# Patient Record
Sex: Female | Born: 1965 | Race: Black or African American | Hispanic: No | Marital: Single | Smoking: Former smoker
Health system: Southern US, Community
[De-identification: ages and names within clinical notes are randomized; demographics above are authoritative.]

## PROBLEM LIST (undated history)

## (undated) DIAGNOSIS — I82409 Acute embolism and thrombosis of unspecified deep veins of unspecified lower extremity: Secondary | ICD-10-CM

## (undated) DIAGNOSIS — D649 Anemia, unspecified: Secondary | ICD-10-CM

## (undated) DIAGNOSIS — I2699 Other pulmonary embolism without acute cor pulmonale: Secondary | ICD-10-CM

## (undated) HISTORY — DX: Other pulmonary embolism without acute cor pulmonale: I26.99

## (undated) HISTORY — DX: Anemia, unspecified: D64.9

## (undated) HISTORY — DX: Acute embolism and thrombosis of unspecified deep veins of unspecified lower extremity: I82.409

---

## 2004-08-02 ENCOUNTER — Ambulatory Visit: Payer: Self-pay | Admitting: Obstetrics and Gynecology

## 2005-02-17 ENCOUNTER — Emergency Department (HOSPITAL_COMMUNITY): Admission: EM | Admit: 2005-02-17 | Discharge: 2005-02-17 | Payer: Self-pay | Admitting: Family Medicine

## 2005-11-29 ENCOUNTER — Ambulatory Visit: Payer: Self-pay | Admitting: Gynecology

## 2010-11-21 ENCOUNTER — Encounter: Payer: Self-pay | Admitting: Obstetrics and Gynecology

## 2011-01-01 DIAGNOSIS — I2699 Other pulmonary embolism without acute cor pulmonale: Secondary | ICD-10-CM

## 2011-01-01 DIAGNOSIS — I82409 Acute embolism and thrombosis of unspecified deep veins of unspecified lower extremity: Secondary | ICD-10-CM

## 2011-01-01 HISTORY — DX: Acute embolism and thrombosis of unspecified deep veins of unspecified lower extremity: I82.409

## 2011-01-01 HISTORY — DX: Other pulmonary embolism without acute cor pulmonale: I26.99

## 2011-01-06 ENCOUNTER — Encounter: Payer: Self-pay | Admitting: Internal Medicine

## 2011-01-06 ENCOUNTER — Emergency Department (HOSPITAL_COMMUNITY): Payer: Self-pay

## 2011-01-06 ENCOUNTER — Inpatient Hospital Stay (HOSPITAL_COMMUNITY)
Admission: EM | Admit: 2011-01-06 | Discharge: 2011-01-12 | DRG: 176 | Disposition: A | Discharge status: Home / Self Care | Payer: 59 | Attending: Internal Medicine | Admitting: Internal Medicine

## 2011-01-06 DIAGNOSIS — D509 Iron deficiency anemia, unspecified: Secondary | ICD-10-CM | POA: Diagnosis present

## 2011-01-06 DIAGNOSIS — I2699 Other pulmonary embolism without acute cor pulmonale: Principal | ICD-10-CM | POA: Diagnosis present

## 2011-01-06 DIAGNOSIS — M79609 Pain in unspecified limb: Secondary | ICD-10-CM

## 2011-01-06 DIAGNOSIS — R000 Tachycardia, unspecified: Secondary | ICD-10-CM | POA: Diagnosis present

## 2011-01-06 DIAGNOSIS — I824Z9 Acute embolism and thrombosis of unspecified deep veins of unspecified distal lower extremity: Secondary | ICD-10-CM | POA: Diagnosis present

## 2011-01-06 DIAGNOSIS — D259 Leiomyoma of uterus, unspecified: Secondary | ICD-10-CM | POA: Diagnosis present

## 2011-01-06 DIAGNOSIS — J189 Pneumonia, unspecified organism: Secondary | ICD-10-CM

## 2011-01-06 DIAGNOSIS — N390 Urinary tract infection, site not specified: Secondary | ICD-10-CM | POA: Diagnosis present

## 2011-01-06 DIAGNOSIS — E876 Hypokalemia: Secondary | ICD-10-CM | POA: Diagnosis present

## 2011-01-06 DIAGNOSIS — B9689 Other specified bacterial agents as the cause of diseases classified elsewhere: Secondary | ICD-10-CM | POA: Diagnosis present

## 2011-01-06 LAB — FOLATE: Folate: 11 ng/mL

## 2011-01-06 LAB — BASIC METABOLIC PANEL
BUN: 8 mg/dL (ref 6–23)
Calcium: 8.3 mg/dL — ABNORMAL LOW (ref 8.4–10.5)
Calcium: 8 mg/dL — ABNORMAL LOW (ref 8.4–10.5)
Creatinine, Ser: 0.52 mg/dL (ref 0.4–1.2)
Creatinine, Ser: 0.47 mg/dL (ref 0.4–1.2)
GFR calc Af Amer: >60 mL/min (ref >60)
GFR calc non Af Amer: >60 mL/min (ref >60)
GFR calc non Af Amer: >60 mL/min (ref >60)
Glucose, Bld: 123 mg/dL — ABNORMAL HIGH (ref 70–99)

## 2011-01-06 LAB — PROTIME-INR
INR: 1.14 (ref 0.00–1.49)
Prothrombin Time: 14.8 seconds (ref 11.6–15.2)

## 2011-01-06 LAB — CBC
MCH: 17.6 pg — ABNORMAL LOW (ref 26.0–34.0)
MCV: 64.2 fL — ABNORMAL LOW (ref 78.0–100.0)
Platelets: 357 10*3/uL (ref 150–400)
Platelets: 368 10*3/uL (ref 150–400)
RBC: 3.07 MIL/uL — ABNORMAL LOW (ref 3.87–5.11)
RBC: 3.52 MIL/uL — ABNORMAL LOW (ref 3.87–5.11)
RDW: 25.1 % — ABNORMAL HIGH (ref 11.5–15.5)
WBC: 11.6 10*3/uL — ABNORMAL HIGH (ref 4.0–10.5)

## 2011-01-06 LAB — DIFFERENTIAL
Eosinophils Absolute: 0.2 10*3/uL (ref 0.0–0.7)
Lymphocytes Relative: 19 % (ref 12–46)
Monocytes Absolute: 1 10*3/uL (ref 0.1–1.0)
Neutrophils Relative %: 70 % (ref 43–77)

## 2011-01-06 LAB — VITAMIN B12: Vitamin B-12: 408 pg/mL (ref 211–911)

## 2011-01-06 LAB — IRON AND TIBC: UIBC: 369 ug/dL

## 2011-01-06 LAB — GLUCOSE, CAPILLARY: Glucose-Capillary: 115 mg/dL — ABNORMAL HIGH (ref 70–99)

## 2011-01-06 MED ORDER — IOHEXOL 300 MG/ML  SOLN
60.0000 mL | Freq: Once | INTRAMUSCULAR | Status: AC | PRN
  Administered 2011-01-06: 60 mL via INTRAVENOUS

## 2011-01-06 NOTE — Discharge Summary (Signed)
Hospital Admission Note Date: 01/06/2011  Patient name: Brandi Delacruz Medical record number: [id number] Date of birth: 05-26-1966 Age: 45 y.o. Gender: female PCP: No primary provider on file.  Medical Service: IMTS  Attending physician: Dr. Julaine Fusi   Pager: Resident (R2/R3): Dr. Baltazar Apo    Pager: [phone] Resident (R1): Dr. Narda Bonds     Pager: [phone]  Chief Complaint: Calf pain  History of Present Illness: Patient is a previously healthy 45 year old African American female who presents to the ED with right lower extremity pain and mild swelling which she first noticed approximately 4 days ago as"tightness in my calf."  The swelling has persisted and her pain has since worsened, and patient now seeks medical attention as she is scared and recognizes "something just isn't right with my leg still hurting."  She reports it has felt hot to the touch and that her pain is worsened with dorsiflexion.  Furthermore, pt reports she has been suffering from a non-painful, productive cough with clear sputum x 7 days.  She does endorse experiencing some left-sided lateral chest pain starting yesterday, for which she has taken ibuprofen 600mg  BID for the past two days.  She denies any recent history of long trips, fevers, hemoptysis, shortness of breath, or nausea/vomiting.  Of note, however, she works Monday through Friday at a job where she sits at a desk for extended periods of time.  Additionally, while in the ED, patient was found to be profoundly anemic on admission labs.  She reports a regular menstrual cycle every 28-30 days with heavy menses lasting 4-5 days and intermittent spotting lasting approximately 2 more days.  On her heaviest days, she reports having to change pads every 2 hours to avoid bleeding through at work.  She has recently experienced her 4-5 days of heavy bleeding and expects to finish up with her menses today, and denies any lightheadedness, dark or bloody stool, or history of GI  bleeds.   Medications:  None  Allergies:  NKDA  Past Medical History: Hx of anemia, previously treated with iron supplementation; however, patient discontinued supplementation 2/2 constipation  Past Surgical History: None  Family History:  No history of clotting or bleeding disorders  Social History:  Patient is a single mother who lives in [place] with her daughter.  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  She uses tobacco and reports smoking 1-2 Black&Mild cigars per day for a number of years.  She also reports drinking alcohol socially.    Review of Systems: Pertinent items are noted in HPI.  Physical Exam: T 99.4, BP 115/68, P 95, RR 21, O2 Sats 97-100% on 2L General appearance: alert, cooperative, no distress and a bit anxious and emotional Head: Normocephalic, without obvious abnormality, atraumatic Neck: no adenopathy, no carotid bruit and no JVD Lungs: clear to auscultation bilaterally Heart: S1, S2 normal and tachycardic, no murmurs or rubs Abdomen: soft, non-tender; bowel sounds normal; no masses,  no organomegaly Extremities: extremities normal, atraumatic, no cyanosis or edema and Homan's sign positive on the right, right calf is also warm and tender to palpation Pulses: 2+ and symmetric Skin: Skin color, texture, turgor normal. No rashes or lesions Neurologic: Grossly normal   Lab results:  CBC:    Component Value Date/Time   WBC 11.6* 01/06/2011 1247   HGB  4.9 CRITICAL RESULT CALLED TO, READ BACK BY AND VERIFIED WITH: Janalyn Shy RN AT 1311 01/06/11 BYY WOOLLENK* 01/06/2011 1247   HCT 18.9* 01/06/2011 1247   PLT 357 01/06/2011 1247   MCV 61.6* 01/06/2011 1247   NEUTROABS 8.2* 01/06/2011 1247   LYMPHSABS 2.2 01/06/2011 1247   MONOABS 1.0 01/06/2011 1247   EOSABS 0.2 01/06/2011 1247    BASOSABS 0.0 01/06/2011 1247   Comprehensive Metabolic Panel:    Component Value Date/Time   NA 137 01/06/2011 1247   K 3.6 01/06/2011 1247   CL 104 01/06/2011 1247   CO2 28 01/06/2011 1247   BUN 8 01/06/2011 1247   CREATININE 0.52 01/06/2011 1247   GLUCOSE 85 01/06/2011 1247   CALCIUM 8.3* 01/06/2011 1247    Imaging results:     CT ANGIOGRAPHY CHEST WITH CONTRAST    IMPRESSION:    1.  Pulmonary emboli involving upper and lower lobe branches of the   pulmonary arteries bilaterally with moderate clot burden.   2.  Mild cardiomegaly.   3.  Small bilateral pulmonary infarcts.   4.  Thoracic scoliosis.    Other results:  Assessment & Plan by Problem: Previously healthy 45 year old with right lower extremity swelling x 4 days and lateral chest pain x 1 day confirmed to have DVT/PE, also incidentally found to have anemia on admission labs.    1. PE/DVT - Provoked vs unprovoked, unlcear. Diagnosed on CTA and LE U/S on admission. Tachycardic, however hemodynamically stable, so no current indication for thrombolysis.  -admit to ICU for closer monitoring given clot burden and tachycardia. -Start a heparin bridge to Coumadin with INR goal of 2-3. Pharmacy to monitor. -Morphine prn for pain -Not currently hypoxemic, but will continue 02 to keep 02 saturation > 90% given mild respiratory distress/anxiety and plan gradually wean off. -IVF - NS@150cc /hr -hypercoag panel in the outpatient setting  2. Microcytic anemia - baseline Hb unknown.  No evidence of acute bleed. This is likely 2/2 iron deficiency from menorrhagia.  -check Fe studies -replete orally with FeSO4; will start colace 100mg  po BID to minimize GI side effects of Fe supplementation -check transvaginal U/S to assess for presence of fibroids. -transfuse 2units; will transfuse additional units to keep Hb>8

## 2011-01-07 ENCOUNTER — Inpatient Hospital Stay (HOSPITAL_COMMUNITY): Payer: Self-pay

## 2011-01-07 LAB — HEPARIN LEVEL (UNFRACTIONATED)
Heparin Unfractionated: 0.13 IU/mL — ABNORMAL LOW (ref 0.30–0.70)
Heparin Unfractionated: 0.33 IU/mL (ref 0.30–0.70)

## 2011-01-07 LAB — COMPREHENSIVE METABOLIC PANEL
AST: 13 U/L (ref 0–37)
Albumin: 2.7 g/dL — ABNORMAL LOW (ref 3.5–5.2)
Calcium: 8.2 mg/dL — ABNORMAL LOW (ref 8.4–10.5)
Creatinine, Ser: <0.47 mg/dL (ref 0.4–1.2)

## 2011-01-07 LAB — CBC
MCHC: 26.8 g/dL — ABNORMAL LOW (ref 30.0–36.0)
RDW: 27 % — ABNORMAL HIGH (ref 11.5–15.5)

## 2011-01-08 LAB — BASIC METABOLIC PANEL
BUN: 4 mg/dL — ABNORMAL LOW (ref 6–23)
CO2: 26 mEq/L (ref 19–32)
CO2: 26 mEq/L (ref 19–32)
Chloride: 107 mEq/L (ref 96–112)
Chloride: 106 mEq/L (ref 96–112)
Glucose, Bld: 93 mg/dL (ref 70–99)
Glucose, Bld: 118 mg/dL — ABNORMAL HIGH (ref 70–99)
Potassium: 3.3 mEq/L — ABNORMAL LOW (ref 3.5–5.1)
Potassium: 3.3 mEq/L — ABNORMAL LOW (ref 3.5–5.1)
Sodium: 139 mEq/L (ref 135–145)

## 2011-01-08 LAB — URINE MICROSCOPIC-ADD ON

## 2011-01-08 LAB — URINALYSIS, ROUTINE W REFLEX MICROSCOPIC
Bilirubin Urine: NEGATIVE
Glucose, UA: NEGATIVE mg/dL
Ketones, ur: NEGATIVE mg/dL
pH: 7.5 (ref 5.0–8.0)

## 2011-01-08 LAB — CBC
HCT: 21.8 % — ABNORMAL LOW (ref 36.0–46.0)
Hemoglobin: 5.8 g/dL — CL (ref 12.0–15.0)
WBC: 10 10*3/uL (ref 4.0–10.5)

## 2011-01-08 LAB — HEPARIN LEVEL (UNFRACTIONATED): Heparin Unfractionated: 0.43 IU/mL (ref 0.30–0.70)

## 2011-01-09 LAB — CBC
HCT: 22.1 % — ABNORMAL LOW (ref 36.0–46.0)
MCHC: 27.1 g/dL — ABNORMAL LOW (ref 30.0–36.0)
MCV: 66.2 fL — ABNORMAL LOW (ref 78.0–100.0)
RDW: 27.9 % — ABNORMAL HIGH (ref 11.5–15.5)

## 2011-01-09 LAB — URINE CULTURE
Colony Count: >=100000
Culture  Setup Time: 201205081657

## 2011-01-09 LAB — PROTIME-INR: Prothrombin Time: 16 seconds — ABNORMAL HIGH (ref 11.6–15.2)

## 2011-01-10 DIAGNOSIS — J189 Pneumonia, unspecified organism: Secondary | ICD-10-CM

## 2011-01-10 LAB — CROSSMATCH
ABO/RH(D): B POS
Antibody Screen: NEGATIVE
Unit division: 0
Unit division: 0

## 2011-01-10 LAB — BASIC METABOLIC PANEL
BUN: 5 mg/dL — ABNORMAL LOW (ref 6–23)
Chloride: 108 mEq/L (ref 96–112)
Creatinine, Ser: <0.47 mg/dL (ref 0.4–1.2)
Glucose, Bld: 94 mg/dL (ref 70–99)

## 2011-01-10 LAB — CBC
MCH: 18.4 pg — ABNORMAL LOW (ref 26.0–34.0)
MCV: 68.9 fL — ABNORMAL LOW (ref 78.0–100.0)
Platelets: 344 10*3/uL (ref 150–400)
RDW: 30.1 % — ABNORMAL HIGH (ref 11.5–15.5)

## 2011-01-10 LAB — HEPARIN LEVEL (UNFRACTIONATED): Heparin Unfractionated: 0.51 IU/mL (ref 0.30–0.70)

## 2011-01-11 ENCOUNTER — Encounter: Payer: Self-pay | Admitting: Internal Medicine

## 2011-01-11 LAB — ANTIPHOSPHOLIPID SYNDROME EVAL, BLD
PTTLA Confirmation: 15.2 secs — ABNORMAL HIGH (ref <8.0)
Phosphatydalserine, IgA: <20 U/mL (ref <20)
Phosphatydalserine, IgM: <25 U/mL (ref <25)

## 2011-01-11 LAB — BASIC METABOLIC PANEL
BUN: 5 mg/dL — ABNORMAL LOW (ref 6–23)
Calcium: 8.9 mg/dL (ref 8.4–10.5)
Potassium: 3.9 mEq/L (ref 3.5–5.1)

## 2011-01-11 LAB — CBC
MCHC: 27.2 g/dL — ABNORMAL LOW (ref 30.0–36.0)
MCV: 69.6 fL — ABNORMAL LOW (ref 78.0–100.0)
Platelets: 319 10*3/uL (ref 150–400)
RDW: 32.7 % — ABNORMAL HIGH (ref 11.5–15.5)
WBC: 10 10*3/uL (ref 4.0–10.5)

## 2011-01-11 LAB — IRON AND TIBC

## 2011-01-11 LAB — HEPARIN LEVEL (UNFRACTIONATED): Heparin Unfractionated: 0.55 IU/mL (ref 0.30–0.70)

## 2011-01-11 LAB — VITAMIN B12: Vitamin B-12: 329 pg/mL (ref 211–911)

## 2011-01-11 NOTE — Progress Notes (Signed)
Ms Read was found to have a DVT/bilateral PE on admission. She was hemodynamically stable throughout her hospitalization and did not need thromobolysis. 2D echo was normal without RV dysfunction but did show elevated pulmonary artery pressure. She also did have fixed splitting of S2 on CV exam. Regardless, she was heparin bridged to Coumadin and her INR was therapeutic on discharge. 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 She will need at least 6 months of anticoag treatment and the hypercoag panel can be repeated after this so as to get confirmation of whether or not she needs life long anticoagulation. She is to get her INR checked on Monday May 14 after her visit with Dr. Allena Katz. pls ensure that she gets this done.  She was also severley anemic on admission, Hb at 4.9 and MCV of 61. This was 2/2 to severe Fe def from chronic menorrhagia. Transvaginal U/S showed 2 large fibroids. Menses was suppressed during her admission with 6 days of Progesterone. She also received one dose of IV Fe. However, she is to f/u at [id number] for further management of her menorrhagia and an appt has been scheduled with Dr. Penne Lash for May 30. In the meantime she is to continue her oral Fe supplements.  Pls see discharge summary for discharge medications and other diagnosis.

## 2011-01-12 LAB — HEPARIN LEVEL (UNFRACTIONATED): Heparin Unfractionated: 0.25 IU/mL — ABNORMAL LOW (ref 0.30–0.70)

## 2011-01-12 LAB — BASIC METABOLIC PANEL
CO2: 28 mEq/L (ref 19–32)
Chloride: 106 mEq/L (ref 96–112)
Creatinine, Ser: 0.47 mg/dL (ref 0.4–1.2)
GFR calc Af Amer: >60 mL/min (ref >60)
Potassium: 3.9 mEq/L (ref 3.5–5.1)
Sodium: 141 mEq/L (ref 135–145)

## 2011-01-12 LAB — DIFFERENTIAL
Basophils Relative: 0 % (ref 0–1)
Monocytes Relative: 10 % (ref 3–12)
Neutro Abs: 5.9 10*3/uL (ref 1.7–7.7)
Neutrophils Relative %: 67 % (ref 43–77)

## 2011-01-12 LAB — CBC
Hemoglobin: 7.3 g/dL — ABNORMAL LOW (ref 12.0–15.0)
MCH: 19.5 pg — ABNORMAL LOW (ref 26.0–34.0)
RBC: 3.75 MIL/uL — ABNORMAL LOW (ref 3.87–5.11)

## 2011-01-12 LAB — PROTIME-INR: INR: 2.63 — ABNORMAL HIGH (ref 0.00–1.49)

## 2011-01-14 ENCOUNTER — Ambulatory Visit (INDEPENDENT_AMBULATORY_CARE_PROVIDER_SITE_OTHER): Payer: 59 | Admitting: Internal Medicine

## 2011-01-14 ENCOUNTER — Ambulatory Visit (INDEPENDENT_AMBULATORY_CARE_PROVIDER_SITE_OTHER): Payer: 59 | Admitting: Pharmacist

## 2011-01-14 ENCOUNTER — Encounter: Payer: Self-pay | Admitting: Internal Medicine

## 2011-01-14 DIAGNOSIS — I2699 Other pulmonary embolism without acute cor pulmonale: Secondary | ICD-10-CM

## 2011-01-14 DIAGNOSIS — I82409 Acute embolism and thrombosis of unspecified deep veins of unspecified lower extremity: Secondary | ICD-10-CM

## 2011-01-14 DIAGNOSIS — Z7901 Long term (current) use of anticoagulants: Secondary | ICD-10-CM

## 2011-01-14 DIAGNOSIS — D259 Leiomyoma of uterus, unspecified: Secondary | ICD-10-CM | POA: Insufficient documentation

## 2011-01-14 LAB — CULTURE, BLOOD (ROUTINE X 2)
Culture  Setup Time: 201205081626
Culture: NO GROWTH

## 2011-01-14 LAB — POCT INR: INR: 5.3

## 2011-01-14 MED ORDER — WARFARIN SODIUM 5 MG PO TABS: ORAL_TABLET | ORAL | Status: DC

## 2011-01-14 NOTE — Assessment & Plan Note (Signed)
She is to be followed up with OB/GYN on 01/30/2011 for evaluation and management of her fibroid.

## 2011-01-14 NOTE — Progress Notes (Signed)
Anti-Coagulation Progress Note  Brandi Delacruz is a 45 y.o. female who is currently on an anti-coagulation regimen.    RECENT RESULTS: Recent results are below, the most recent result is correlated with a dose of warfarin dosed by the inpatient pharmacy dept. Lab Results  Component Value Date   INR 5.3 01/14/2011   INR 2.63* 01/12/2011   INR 2.12* 01/11/2011    ANTI-COAG DOSE:   Latest dosing instructions   Total Sun Mon Tue Wed Thu Fri Sat   25 5 mg  2.5 mg 5 mg 5 mg 2.5 mg 5 mg    (5 mg1)  (5 mg0.5) (5 mg1) (5 mg1) (5 mg0.5) (5 mg1)         ANTICOAG SUMMARY: Anticoagulation Episode Summary              Current INR goal 2.0-3.0 Next INR check 01/21/2011   INR from last check 5.3! (01/14/2011)     Weekly max dose (mg)  Target end date 01/14/2012   Indications    INR check location [facility]erred lab    Send INR reminders to ANTICOAG IMP   Comments             ANTICOAG TODAY: Anticoagulation Summary as of 01/14/2011              INR goal 2.0-3.0     Selected INR 5.3! (01/14/2011) Next INR check 01/21/2011   Weekly max dose (mg)  Target end date 01/14/2012   Indications     Anticoagulation Episode Summary              INR check location [facility] Preferred lab    Send INR reminders to ANTICOAG IMP   Comments             PATIENT INSTRUCTIONS: Patient Instructions  Patient instructed to take medications as defined in the Anti-coagulation Track section of this encounter.  Patient instructed to OMIT/HOLD today's dose.  Patient verbalized understanding of these instructions.        FOLLOW-UP Return in 7 days (on 01/21/2011) for Follow up INR.  Hulen Luster, III Pharm.D., CACP

## 2011-01-14 NOTE — Progress Notes (Signed)
  Subjective:    Patient ID: Brandi Delacruz, female    DOB: [birth year], 45 y.o.   MRN: [id number]  HPI Ms. Zaro is a pleasant 45 year old woman with recent admission to the hospital for DVT and bilateral PE comes for a followup visit. She is new to the clinic. She had about 7 days of hospitalization with bridging therapy with heparin and Coumadin and was discharged after INR was therapeutic. She said she got good physical therapy and is doing exercises as instructed and is taking her Coumadin regularly. She still has pain in the right leg but says is much better than what she had in hospital. Also says that the swelling has decreased significantly and she is feeling better now. Complains of some cough with phlegm for last 2 days but denies any fever, chills, chest pain, shortness of breath or blood in the phlegm. Denies any headache, palpitations or abdominal pain, diarrhea, dizziness the   Review of Systems    as per history of present illness Objective:   Physical Exam    Constitutional: Vital signs reviewed.  Patient is a well-developed and well-nourished in no acute distress and cooperative with exam. Alert and oriented x3.  Head: Normocephalic and atraumatic Mouth: no erythema or exudates, MMM Eyes: PERRL, EOMI, conjunctivae normal, No scleral icterus.  Neck: Supple, Trachea midline normal ROM, No JVD, mass, thyromegaly, or carotid bruit present.  Cardiovascular: RRR, S1 normal, S2 normal, no MRG, pulses symmetric and intact bilaterally Pulmonary/Chest: CTAB, no wheezes, rales, or rhonchi Abdominal: Soft. Non-tender, non-distended, bowel sounds are normal, no masses, organomegaly, or guarding present.  Musculoskeletal: Right leg-mild tenderness to palpation to the calf. Right leg is swollen and tender compared to the left. No significant warmth, rash. Neurological: A&O x3, Strenght is normal and symmetric bilaterally, cranial nerve II-XII are grossly intact, no focal motor deficit,  sensory intact to light touch bilaterally.  Skin: Warm, dry and intact. No rash, cyanosis, or clubbing.      Assessment & Plan:

## 2011-01-14 NOTE — Assessment & Plan Note (Signed)
Discharge from hospital after bridging therapy with heparin and Coumadin. She'll see Dr. Alexandria Lodge today and get her INR checked and adjusted dose of Coumadin according to that. Her swelling in the right leg is a significantly decreased compared to the admission swelling as per patient. She will need 6 months of Coumadin therapy and then reevaluation for continuation or not.

## 2011-01-14 NOTE — Assessment & Plan Note (Signed)
As per DVT

## 2011-01-14 NOTE — Patient Instructions (Signed)
Patient instructed to take medications as defined in the Anti-coagulation Track section of this encounter.  Patient instructed to OMIT/HOLD today's dose.  Patient verbalized understanding of these instructions.    

## 2011-01-14 NOTE — Patient Instructions (Addendum)
Please make a follow up appointment inh 6-8 weeks. Please take your coumdin regularly and follow with Dr. Alexandria Lodge for INR check and dose adjustment with coumadin. Also [facility]

## 2011-01-15 ENCOUNTER — Telehealth: Payer: Self-pay | Admitting: *Deleted

## 2011-01-15 ENCOUNTER — Encounter: Payer: Self-pay | Admitting: Internal Medicine

## 2011-01-15 NOTE — Telephone Encounter (Signed)
Pt called and states she was in hospital from 5/6 to 5/12. She needs a note to document that she was in hospital and the  Amount to time she will need to be out of work.  Can you write this?

## 2011-01-16 NOTE — Telephone Encounter (Signed)
Letter written and faxed to pt.

## 2011-01-21 ENCOUNTER — Ambulatory Visit (INDEPENDENT_AMBULATORY_CARE_PROVIDER_SITE_OTHER): Payer: 59 | Admitting: Pharmacist

## 2011-01-21 DIAGNOSIS — Z7901 Long term (current) use of anticoagulants: Secondary | ICD-10-CM

## 2011-01-21 DIAGNOSIS — I2699 Other pulmonary embolism without acute cor pulmonale: Secondary | ICD-10-CM

## 2011-01-21 DIAGNOSIS — I82409 Acute embolism and thrombosis of unspecified deep veins of unspecified lower extremity: Secondary | ICD-10-CM

## 2011-01-21 LAB — POCT INR: INR: 3.3

## 2011-01-21 NOTE — Patient Instructions (Signed)
Patient instructed to take medications as defined in the Anti-coagulation Track section of this encounter.  Patient instructed to take today's dose.  Patient verbalized understanding of these instructions.    

## 2011-01-21 NOTE — Progress Notes (Signed)
Anti-Coagulation Progress Note  Brandi Delacruz is a 45 y.o. female who is currently on an anti-coagulation regimen.    RECENT RESULTS: Recent results are below, the most recent result is correlated with a dose of 20 mg over 6 days. Lab Results  Component Value Date   INR 3.3 01/21/2011   INR 5.3 01/14/2011   INR 2.63* 01/12/2011    ANTI-COAG DOSE:   Latest dosing instructions   Total Glynis Smiles Tue Wed Thu Fri Sat   25 5 mg 2.5 mg 2.5 mg 5 mg 2.5 mg 5 mg 2.5 mg    (5 mg1) (5 mg0.5) (5 mg0.5) (5 mg1) (5 mg0.5) (5 mg1) (5 mg0.5)         ANTICOAG SUMMARY: Anticoagulation Episode Summary              Current INR goal 2.0-3.0 Next INR check 01/29/2011   INR from last check 3.3! (01/21/2011)     Weekly max dose (mg)  Target end date 01/14/2012   Indications    INR check location [facility]erred lab    Send INR reminders to ANTICOAG IMP   Comments             ANTICOAG TODAY: Anticoagulation Summary as of 01/21/2011              INR goal 2.0-3.0     Selected INR 3.3! (01/21/2011) Next INR check 01/29/2011   Weekly max dose (mg)  Target end date 01/14/2012   Indications     Anticoagulation Episode Summary              INR check location [facility] Preferred lab    Send INR reminders to ANTICOAG IMP   Comments             PATIENT INSTRUCTIONS: Patient Instructions  Patient instructed to take medications as defined in the Anti-coagulation Track section of this encounter.  Patient instructed to take today's dose.  Patient verbalized understanding of these instructions.        FOLLOW-UP Return in 8 days (on 01/29/2011) for Follow up INR.  Hulen Luster, III Pharm.D., CACP

## 2011-01-23 NOTE — Discharge Summary (Signed)
NAME:  Brandi Delacruz, Brandi Delacruz               ACCOUNT NO.:  [id number]  MEDICAL RECORD NO.:  [id number]           PATIENT TYPE:  I  LOCATION:  2003                         FACILITY:  [facility]  PHYSICIAN:  Edsel Petrin, D.O.DATE OF BIRTH:  1965-09-23  DATE OF ADMISSION:  01/06/2011 DATE OF DISCHARGE:  01/12/2011                              DISCHARGE SUMMARY   DISCHARGE DIAGNOSES: 1. Deep venous thrombosis. 2. Pulmonary embolism. 3. Iron-deficiency anemia. 4. Menorrhagia. 5. Leiomyomata x2. 6. Hypokalemia, resolved. 7. Urinary tract infection with Trichomonas.  DISCHARGE MEDICATIONS: 1. Coumadin 7.5 mg taken orally until seen by Dr. Michaell Cowing at the     [facility]. 2. Colace 100 mg taken orally twice daily for constipation. 3. Ferrous sulfate 325 mg taken orally twice daily. 4. Percocet 5/325 mg tablets take 1 tablet every 4 hours as needed for     pain. 5. Tussionex 5 mL taken orally twice daily as needed for cough. 6. Tessalon 100 mg tablets taken orally 3 times a day as needed for     cough.  DISPOSITION AND FOLLOWUP:  The patient was discharged home in stable condition and is to follow up with Dr. Michaell Cowing at the [facility] on Jan 14, 2011, at 8:15 a.m.  At this appointment, her INR is to be rechecked.  The patient is also to follow with Dr. Lyn Hollingshead at the [facility] where she should be followed up regarding her Coumadin therapy, her cough as well as her pain symptoms.  The patient is also to follow up with GYN, Dr. Penne Lash on Jan 30, 2011, at 1:45 p.m.  At this appointment, the patient is to have her fibroid evaluated further.  PROCEDURES: 1. The patient had a CT angio on Jan 06, 2011, which showed pulmonary     emboli involving upper and lower lobe branches of the pulmonary     arteries bilaterally with moderate clot burden, mild cardiomegaly,     small bilateral pulmonary infarct and thoracic scoliosis. 2. A transvaginal ultrasound, which showed  an enlarged uterus but     least two large fundal fibroid measuring 8.1 cm and 7.6 cm. 3. The patient had a 2-D echo on Jan 08, 2011, which showed ejection     fraction 60-65%, diastolic parameters were normal with a pulmonary     artery peak pressure of 41 mmHg.  CONSULTATIONS:  None.  INITIAL HISTORY AND PHYSICAL:  On day of admission, the patient is a previously healthy 45 year old African American woman who presented to the emergency department with right lower extremity and mild swelling, which she first noticed 4 days prior to admission and described as tightness in her cast.  The swelling has persisted and her pain since worsened and the patient went to seek medical attention.  She reports it felt hot to the touch and the pain had worsened with dorsi flexion. Furthermore, the patient reports that she had been suffering from a nonpainful productive cough with clear sputum for 7 days.  She did endorse experiencing some left-sided lateral chest pain starting 1 day prior to admission for which she took ibuprofen 600  mg twice a day for 2 days.  She denied any history of long trips, fevers, hemoptysis, shortness of breath or nausea and vomiting.  Of note, she works Monday through Friday at a job where she sits at her desk for extended periods of time.  Additionally while in the emergency department, she was found to be profoundly anemic on it with her admission labs.  She reports a regular menstrual cycles every 28-30 days with heavy menses lasting 4-5 days and intermittent spotting lasting approximately 2 more days.  On her heaviest day, she reports having to change her pads every 2 hours to avoid bleeding at work.  She recently experienced her 4-5 days of heavy bleeding.  In fact, she finished with her menses on day of admission. She denied any lightheadedness, darker bloody stools or history of GI bleed.  She had no outpatient medications.  ALLERGIES:  No known drug  allergies.  PAST MEDICAL HISTORY:  History of anemia, previously treated with iron supplementation but, however, she has stopped taking iron supplements.  FAMILY HISTORY:  No history of clotting or bleeding disorders.  GYN HISTORY:  As mentioned above.  The patient is G3, P1 previous pregnancies actually for elective abortion.  SOCIAL HISTORY:  She is a single mother living in [place] with her daughter.  PHYSICAL EXAMINATION:  VITALS:  On day of admission temperature 99.4, blood pressure 115/68, pulse 95, respirations 21, O2 sat 97% on 2 liters. GENERAL:  The patient was alert, cooperative in no distress but a little bit anxious. HEAD:  Normocephalic without obvious abnormalities. NECK:  No adenopathy.  No carotid bruits. LUNGS:  Clear to auscultation bilaterally. HEART:  S1-S2 normal and was tachycardic.  No murmurs or rubs. ABDOMEN:  Soft, nontender.  Bowel sounds normal.  No masses.  No organomegaly. EXTREMITIES:  Extremities were normal atraumatic.  No cyanosis or edema and there was Homans signs positive on the right.  Right calf was also warm and tender to palpation.  Pulse is 2+ and symmetric. SKIN:  Skin color was texture and turgor normal.  No rashes or lesions. NEUROLOGIC:  Grossly normal.  LABORATORY DATA:  The patient had a WBC count of 11.6, hemoglobin 4.9, hematocrit 18.9, platelets 257.  Sodium of 137, potassium 3.6, chloride 104, bicarb 28, BUN 8, creatinine 0.52, glucose 85, calcium 8.3.  HOSPITAL COURSE BY PROBLEM: 1. Deep venous thrombosis/pulmonary emboli.  The patient initially     presented with right calf tenderness and swelling.  She was found     to have a deep venous thrombosis and because of cough, she also had     a CT angio of her chest, which showed pulmonary emboli involving     the upper and lower branches of the pulmonary arteries bilaterally     with moderate clot burden along with small bilateral pulmonary     infarcts.  The patient was  started on Lovenox bridge to Coumadin.     Her INR was therapeutic after 3 days of bridging and was continued     for a total of 5 days.  The patient was controlled with morphine     and Percocet during hospital stay.  Given the patient's     presentation of DVT and pulmonary embolus with no known risk     factors.  The patient was tested for antiphospholipid syndrome.  It     was detected, however, the patient did receive 4000 units of     heparin prior to  the test and since it is a PTT base study may be a     false positive.  A hypercoagulable panel was need to be checked as     an outpatient once the patient is off anticoagulation.  The patient     will continue on Coumadin for at least 6 months. 2. Iron-deficiency anemia.  On admission, the patient was     hemodynamically stable with some slight tachycardia.  Initial labs     showed a hemoglobin of 4.9.  The patient was found to have regular     heavy menses and reported that she had just finished 5-6 days of     heavy bleeding during which she changed her past every 2 hours.     Anemia.  Therefore was thought to be secondary to chronic regular     menorrhagia.  Transfusion was started with PRBCs, however, the     patient subsequently became febrile.  Transfusion of only 1 unit     was completed.  Additional units were not given as there was     concern for transfusion reaction and the patient was     hemodynamically stable.  Additionally after admission, the patient     remained stable with normal heart rate, satting well, and denies     shortness of breath.  Iron was repleted orally and through IV.  The     patient was discharged with a prescription of ferrous sulfate to be     taken twice a day.  She is also discharged on Colace to help with     any iron related constipation. 3. Menorrhagia.  Given the patient's history of irregular heavy     bleeding, this was felt to be causes of the patient's iron-     deficiency anemia.  The  patient's menses was initially suppress     with medroxyprogesterone, which was taken for 6 days.  The patient     was previously seen at [facility] in the past and she was     instructed to follow up with GYN. 4. Leiomyomata.  Transvaginal ultrasound was done on Jan 07, 2011,     which showed enlarged uterus with two large fundal fibroid.     Findings were thought to contribute to the patient's menorrhagia     and she is to follow with GYN as scheduled. 5. Hypokalemia.  This was resolved with oral repletion.  No magnesium     was checked and was within normal limits. 6. Urinary tract infection with Trichomonas.  The patient denied     having any urinary symptoms including dysuria, increased frequency,     urge or discoloration of urine.  It was considering an     uncomplicated urinary tract infection.  She was given ciprofloxacin     for 3 days and a single dose of Flagyl.  She did, however, complain     of some abdominal tenderness.  DISCHARGE VITALS:  On day of discharge, temperature 97, pulse 78, respirations 20, blood pressure 110/71, O2 sat of 99% on room air.  LABS ON DAY OF DISCHARGE:  The patient had a white count of 8.9, hemoglobin 7.3, hematocrit 27.2 and platelets of 291.  INR on day of discharge was therapeutic at 2.63 and BMET with a sodium of 141, potassium 3.9, chloride 106, bicarb 28, glucose 100, BUN 9, creatinine 0.47.     Melida Quitter, MD   ______________________________ Edsel Petrin, D.O.  AS/MEDQ  D:  01/13/2011  T:  01/13/2011  Job:  [id number]  cc:   Lesly Dukes, M.D. Lyn Hollingshead, MD  Electronically Signed by Melida Quitter  on 01/16/2011 02:35:25 PM Electronically Signed by Anderson Malta D.O. on 01/23/2011 01:48:04 PM

## 2011-01-29 ENCOUNTER — Ambulatory Visit (INDEPENDENT_AMBULATORY_CARE_PROVIDER_SITE_OTHER): Payer: 59 | Admitting: Pharmacist

## 2011-01-29 DIAGNOSIS — I82409 Acute embolism and thrombosis of unspecified deep veins of unspecified lower extremity: Secondary | ICD-10-CM

## 2011-01-29 DIAGNOSIS — Z7901 Long term (current) use of anticoagulants: Secondary | ICD-10-CM

## 2011-01-29 DIAGNOSIS — I2699 Other pulmonary embolism without acute cor pulmonale: Secondary | ICD-10-CM

## 2011-01-29 LAB — POCT INR: INR: 3.3

## 2011-01-29 NOTE — Patient Instructions (Signed)
Patient instructed to take medications as defined in the Anti-coagulation Track section of this encounter.  Patient instructed to OMIT today's dose.  Patient verbalized understanding of these instructions.    

## 2011-01-29 NOTE — Progress Notes (Signed)
Anti-Coagulation Progress Note  Brandi Delacruz is a 45 y.o. female who is currently on an anti-coagulation regimen.    RECENT RESULTS: Recent results are below, the most recent result is correlated with a dose of 22.5 mg. per week: Lab Results  Component Value Date   INR 3.3 01/29/2011   INR 3.3 01/21/2011   INR 5.3 01/14/2011    ANTI-COAG DOSE:   Latest dosing instructions   Total Sun Mon Tue Wed Thu Fri Sat   22.5 2.5 mg 5 mg 2.5 mg 2.5 mg 5 mg 2.5 mg 2.5 mg    (5 mg0.5) (5 mg1) (5 mg0.5) (5 mg0.5) (5 mg1) (5 mg0.5) (5 mg0.5)         ANTICOAG SUMMARY: Anticoagulation Episode Summary              Current INR goal 2.0-3.0 Next INR check 02/19/2011   INR from last check 3.3! (01/29/2011)     Weekly max dose (mg)  Target end date 01/14/2012   Indications    IN[facility] check location [facility]    Send INR reminders to [facility]   Comments             ANTICOAG TODAY: Anticoagulation Summary as of 01/29/2011              INR goal 2.0-3.0     Selected INR 3.3! (01/29/2011) Next INR check 02/19/2011   Weekly max dose (mg)  Target end date 01/14/2012   Indications     Anticoagulation Episode Summary              INR check location [facility]    Send INR reminders to [facility]   Comments             PATIENT INSTRUCTIONS: Patient Instructions  Patient instructed to take medications as defined in the Anti-coagulation Track section of this encounter.  Patient instructed to OMIT today's dose.  Patient verbalized understanding of these instructions.        FOLLOW-UP Return in 3 weeks (on 02/19/2011) for Follow up INR.  Hulen Luster, III Pharm.D., CACP

## 2011-01-30 ENCOUNTER — Other Ambulatory Visit: Payer: Self-pay | Admitting: Obstetrics & Gynecology

## 2011-01-30 ENCOUNTER — Ambulatory Visit (INDEPENDENT_AMBULATORY_CARE_PROVIDER_SITE_OTHER): Payer: 59 | Admitting: Obstetrics & Gynecology

## 2011-01-30 ENCOUNTER — Other Ambulatory Visit (HOSPITAL_COMMUNITY)
Admission: RE | Admit: 2011-01-30 | Discharge: 2011-01-30 | Disposition: A | Discharge status: Home / Self Care | Payer: 59 | Source: Ambulatory Visit | Attending: Obstetrics & Gynecology | Admitting: Obstetrics & Gynecology

## 2011-01-30 ENCOUNTER — Other Ambulatory Visit: Payer: Self-pay | Admitting: Family Medicine

## 2011-01-30 DIAGNOSIS — N949 Unspecified condition associated with female genital organs and menstrual cycle: Secondary | ICD-10-CM | POA: Insufficient documentation

## 2011-01-30 DIAGNOSIS — Z01419 Encounter for gynecological examination (general) (routine) without abnormal findings: Secondary | ICD-10-CM

## 2011-01-30 DIAGNOSIS — N938 Other specified abnormal uterine and vaginal bleeding: Secondary | ICD-10-CM | POA: Insufficient documentation

## 2011-01-30 DIAGNOSIS — N920 Excessive and frequent menstruation with regular cycle: Secondary | ICD-10-CM

## 2011-01-30 DIAGNOSIS — Z1231 Encounter for screening mammogram for malignant neoplasm of breast: Secondary | ICD-10-CM

## 2011-01-30 DIAGNOSIS — D259 Leiomyoma of uterus, unspecified: Secondary | ICD-10-CM

## 2011-01-31 NOTE — Group Therapy Note (Signed)
NAME:  Brandi Delacruz, Brandi Delacruz               ACCOUNT NO.:  [id number]  MEDICAL RECORD NO.:  [id number]           PATIENT TYPE:  A  LOCATION:  [facility]                   FACILITY:  [facility]  PHYSICIAN:  Elsie Lincoln, MD      DATE OF BIRTH:  12-14-1965  DATE OF SERVICE:                                 CLINIC NOTE  The patient presents for followup of fibroids in menorrhagia causing anemia.  The patient was discharged from the hospital on Jan 12, 2011, with anemia.  It looks like she got 2 units of blood, but it is unclear from the discharge dictation.  She was discharged with a hemoglobin of 7.3.  I gave her prescription for ferrous sulfate.  She was hospitalized mostly for the pain process and bilateral pulmonary embolism.  She has lupus anticoagulant positive, and she is currently on Coumadin, which was slightly supratherapeutic and they changed her dose.  So, she has only been on anticoagulation for approximately 2 weeks.  She was noted to have an enlarged uterus approximately 14-16 weeks' size with 2 large fibroids.  She has been having mild dysmenorrhea with heavy periods for a long time.  She never had diagnosis of fibroids before.  PAST MEDICAL HISTORY: 1. Anemia. 2. Now DVT. 3. Lupus anticoagulant positive. 4. Pulmonary embolism.  PAST SURGICAL HISTORY:  Denies all surgeries.  OBSTETRICAL HISTORY:  NSVD x1 and three terminations.  Heavy periods as above.  She has had abnormal Pap smear in 1990 that inquire colpo.  She never had freezing or burning of her cervix.  The last Pap smear here was in 2007 and normal.  FAMILY HISTORY:  Positive for a sister with breast cancer, but her sister who tested for the breast cancer gene and is negative.  There is no colon, ovarian, or uterine cancer.  The mother has diabetes and high blood pressure.  The patient did have high blood pressure when she was pregnant.  SOCIAL HISTORY:  She is employed as a Occupational psychologist. She lives  with her daughter.  She quit smoking in 2012.  She does drink alcohol once per week.  She has used marijuana in the past, but not recently.  Never been sexually or physically abused.  Systemic review is positive for abdominal pain, cough, and leg swelling.  MEDICATIONS:  Coumadin 2.5 mg 5 times a week and 5 mg p.o. 2 times a week, iron 325 mg p.o. b.i.d., and Colace 100 mg p.o. b.i.d.  ALLERGIES:  None.  PHYSICAL EXAM:  VITAL SIGNS:  Temperature 98.3, pulse 73, blood pressure 126/83, weight 151.2, heights 63 inches. GENERAL:  Well-nourished, well-developed, in no apparent distress. HEENT:  Normocephalic, atraumatic. THYROID:  No masses. LUNGS:  Clear to auscultation bilaterally. HEART:  Regular rate and rhythm. ABDOMEN:  Soft and nontender.  No organomegaly.  No hernia. GENITALIA:  Tanner V.  Vagina pink.  Normal rugae.  Cervix is closed and nontender.  Uterus 14-16 weeks' size, mildly tender. EXTREMITIES:  No exam repeated given her presence of DVT.  SEPARATE PROCEDURE:  After informed consent was obtained, the patient was placed in dorsal lithotomy position.  Urine pregnancy test  was confirmed to be negative.  Speculum was placed in the vagina and cervix was placed into view.  The cervix was cleaned with Betadine and 3 passes with Pipelle were obtained.  There is a lot of blood in all these collections.  The uterus sounded approximately 11-12 cm.  The patient tolerated the procedure well.  ASSESSMENT AND PLAN:  A 45 year old female with a large fibroid uterus, active pulmonary embolism, painful, anticoagulated, and lupus anticoagulant positive.  The patient also has profound anemia, on iron. I am unsure whether she has received a blood transfusion, but she definitely received iron on her last hospitalization. 1. We gave her Lupron 11.25 mg administered, but she is not able to     have any surgical procedure given the pulmonary embolism.  She is     currently bleeding today.   The patient understands we will put her     on a medical menopause, this is temporary for 3-6 months until she     gets out of her acute medical condition and can have surgery. 2. Endometrial biopsy done. 3. Pap smear done. 4. Return to the office in 3 months for another shot of Depo Lupron     and to schedule for that.          ______________________________ Elsie Lincoln, MD    KL/MEDQ  D:  01/30/2011  T:  01/31/2011  Job:  [id number]

## 2011-02-08 ENCOUNTER — Ambulatory Visit (HOSPITAL_COMMUNITY)
Admission: RE | Admit: 2011-02-08 | Discharge: 2011-02-08 | Disposition: A | Discharge status: Home / Self Care | Payer: 59 | Source: Ambulatory Visit | Attending: Family Medicine | Admitting: Family Medicine

## 2011-02-08 DIAGNOSIS — Z1231 Encounter for screening mammogram for malignant neoplasm of breast: Secondary | ICD-10-CM | POA: Insufficient documentation

## 2011-02-18 ENCOUNTER — Ambulatory Visit (INDEPENDENT_AMBULATORY_CARE_PROVIDER_SITE_OTHER): Payer: Self-pay | Admitting: Pharmacist

## 2011-02-18 DIAGNOSIS — I2699 Other pulmonary embolism without acute cor pulmonale: Secondary | ICD-10-CM

## 2011-02-18 DIAGNOSIS — I82409 Acute embolism and thrombosis of unspecified deep veins of unspecified lower extremity: Secondary | ICD-10-CM

## 2011-02-18 DIAGNOSIS — Z7901 Long term (current) use of anticoagulants: Secondary | ICD-10-CM

## 2011-02-18 MED ORDER — WARFARIN SODIUM 5 MG PO TABS: ORAL_TABLET | ORAL | Status: DC

## 2011-02-18 NOTE — Progress Notes (Signed)
Addended by: Hulen Luster B on: 02/18/2011 09:55 AM   Modules accepted: Orders

## 2011-02-18 NOTE — Patient Instructions (Signed)
Patient instructed to take medications as defined in the Anti-coagulation Track section of this encounter.  Patient instructed to take today's dose.  Patient verbalized understanding of these instructions.    

## 2011-02-18 NOTE — Progress Notes (Signed)
Anti-Coagulation Progress Note  Brandi Delacruz is a 45 y.o. female who is currently on an anti-coagulation regimen.    RECENT RESULTS: Recent results are below, the most recent result is correlated with a dose of 22.5 mg. per week: Lab Results  Component Value Date   INR 2.0 02/18/2011   INR 3.3 01/29/2011   INR 3.3 01/21/2011    ANTI-COAG DOSE:   Latest dosing instructions   Total Sun Mon Tue Wed Thu Fri Sat   27.5 2.5 mg 5 mg 5 mg 5 mg 5 mg 2.5 mg 2.5 mg    (5 mg0.5) (5 mg1) (5 mg1) (5 mg1) (5 mg1) (5 mg0.5) (5 mg0.5)         ANTICOAG SUMMARY: Anticoagulation Episode Summary              Current INR goal 2.0-3.0 Next INR check 02/25/2011   INR from last check 2.0 (02/18/2011)     Weekly max dose (mg)  Target end date 01/14/2012   Indications    [facility]    Send INR reminders to [facility]   Comments             ANTICOAG TODAY: Anticoagulation Summary as of 02/18/2011              INR goal 2.0-3.0     Selected INR 2.0 (02/18/2011) Next INR check 02/25/2011   Weekly max dose (mg)  Target end date 01/14/2012   Indications     Anticoagulation Episode Summary              IN[facility] check location [facility]    Send INR reminders to [facility]   Comments             PATIENT INSTRUCTIONS: Patient Instructions  Patient instructed to take medications as defined in the Anti-coagulation Track section of this encounter.  Patient instructed to take today's dose.  Patient verbalized understanding of these instructions.        FOLLOW-UP Return in about 7 days (around 02/25/2011) for Follow up INR.  Hulen Luster, III Pharm.D., CACP

## 2011-02-25 ENCOUNTER — Ambulatory Visit: Payer: 59

## 2011-02-28 ENCOUNTER — Ambulatory Visit: Payer: 59 | Admitting: Obstetrics & Gynecology

## 2011-02-28 DIAGNOSIS — N920 Excessive and frequent menstruation with regular cycle: Secondary | ICD-10-CM

## 2011-02-28 DIAGNOSIS — D259 Leiomyoma of uterus, unspecified: Secondary | ICD-10-CM

## 2011-03-01 NOTE — Group Therapy Note (Signed)
NAME:  Brandi Delacruz, Brandi Delacruz               ACCOUNT NO.:  [id number]  MEDICAL RECORD NO.:  [id number]           PATIENT TYPE:  A  LOCATION:  [facility]                   FACILITY:  [facility]  PHYSICIAN:  Elsie Lincoln, MD      DATE OF BIRTH:  01-Aug-1966  DATE OF SERVICE:  02/28/2011                                 CLINIC NOTE  HISTORY OF PRESENT ILLNESS:  The patient is a 45 year old G4, para 1-0-3- 1 who is here for followup of menorrhagia and endometrial biopsy results.  The patient is recently diagnosed with a pulmonary embolism is now on Coumadin.  She is lupus anticoagulant positive.  She has been seen in [facility] at [facility], but she would like to be seen at office where there is slightly more severe access to the physician.  She does have health insurance and I am referring her to [facility].  We discussed again that not doing a hysterectomy while on Coumadin.  We will get a surgical clearance from her internal medicine doctor and recommendations for perioperative DVT prophylaxis.  PAST MEDICAL HISTORY:  Anemia, DVT, pulmonary embolism, lupus anticoagulant positive.  PAST SURGICAL HISTORY:  None.  OBSTETRICAL HISTORY:  NSVD times one with 3 terminations.  GYN HISTORY:  One abnormal Pap smear in 1990.  Her Pap smear that was done here at her last visit is normal.  Pap smear was done in 2012.  FAMILY HISTORY:  History of a cyst with breast cancer but BRACA negative.  No colon, ovarian or uterine cancer.  SOCIAL HISTORY:  She is employed as a Occupational psychologist at [employer].  She lives with her daughter.  SYSTEMIC REVIEW:  Positive for mild vaginal bleeding currently.  MEDICATIONS:  Coumadin.  Iron and Colace as on med rec.  ALLERGIES:  None.  Endometrial biopsy results negative for malignancy or hyperplasia.  CBC last hemoglobin count was 11.1 a month ago.  TSH was normal 1.44.  Physical exam.  No physical exam is done today just  consultation of plan.  ASSESSMENT/PLAN:  A 45 year old female with deep vein thrombosis, pulmonary embolism, lupus anticoagulant positive.  The patient also has a fibroid uterus with severe menorrhagia. 1. Continue Depo Lupron.  The patient is to come back bu August for     another injection. 2. She is planned for surgery in November when she is off the Coumadin     and cleared for surgery from her internal medicine provider.          ______________________________ Elsie Lincoln, MD    KL/MEDQ  D:  02/28/2011  T:  03/01/2011  Job:  [id number]

## 2011-03-25 ENCOUNTER — Ambulatory Visit (INDEPENDENT_AMBULATORY_CARE_PROVIDER_SITE_OTHER): Payer: 59 | Admitting: Pharmacist

## 2011-03-25 DIAGNOSIS — I82409 Acute embolism and thrombosis of unspecified deep veins of unspecified lower extremity: Secondary | ICD-10-CM

## 2011-03-25 DIAGNOSIS — I2699 Other pulmonary embolism without acute cor pulmonale: Secondary | ICD-10-CM

## 2011-03-25 DIAGNOSIS — Z7901 Long term (current) use of anticoagulants: Secondary | ICD-10-CM

## 2011-03-25 NOTE — Patient Instructions (Signed)
Patient instructed to take medications as defined in the Anti-coagulation Track section of this encounter.  Patient instructed to take today's dose.  Patient verbalized understanding of these instructions.    

## 2011-03-25 NOTE — Progress Notes (Signed)
Anti-Coagulation Progress Note  Brandi Delacruz is a 45 y.o. female who is currently on an anti-coagulation regimen.    RECENT RESULTS: Recent results are below, the most recent result is correlated with a dose of 27.5 mg. per week: Lab Results  Component Value Date   INR 2.50 03/25/2011   INR 2.0 02/18/2011   INR 3.3 01/29/2011    ANTI-COAG DOSE:   Latest dosing instructions   Total Sun Mon Tue Wed Thu Fri Sat   27.5 2.5 mg 5 mg 5 mg 5 mg 5 mg 2.5 mg 2.5 mg    (5 mg0.5) (5 mg1) (5 mg1) (5 mg1) (5 mg1) (5 mg0.5) (5 mg0.5)         ANTICOAG SUMMARY: Anticoagulation Episode Summary              Current INR goal 2.0-3.0 Next INR check 04/22/2011   INR from last check 2.50 (03/25/2011)     Weekly max dose (mg)  Target end date 01/14/2012   Indications    [facility]    Send INR reminders to [facility]   Comments             ANTICOAG TODAY: Anticoagulation Summary as of 03/25/2011              INR goal 2.0-3.0     Selected INR 2.50 (03/25/2011) Next INR check 04/22/2011   Weekly max dose (mg)  Target end date 01/14/2012   Indications     Anticoagulation Episode Summary              INR check location [facility]    Send INR reminders to [facility]   Comments             PATIENT INSTRUCTIONS: Patient Instructions  Patient instructed to take medications as defined in the Anti-coagulation Track section of this encounter.  Patient instructed to take today's dose.  Patient verbalized understanding of these instructions.        FOLLOW-UP Return in 4 weeks (on 04/22/2011) for Follow up INR.  Hulen Luster, III Pharm.D., CACP

## 2011-04-17 ENCOUNTER — Ambulatory Visit: Payer: Self-pay

## 2011-04-19 ENCOUNTER — Ambulatory Visit (INDEPENDENT_AMBULATORY_CARE_PROVIDER_SITE_OTHER): Payer: 59 | Admitting: *Deleted

## 2011-04-19 VITALS — BP 127/82 | HR 70 | Temp 98.5°F

## 2011-04-19 DIAGNOSIS — D259 Leiomyoma of uterus, unspecified: Secondary | ICD-10-CM

## 2011-04-19 MED ORDER — LEUPROLIDE ACETATE (3 MONTH) 11.25 MG IM KIT
11.2500 mg | PACK | Freq: Once | INTRAMUSCULAR | Status: AC
  Administered 2011-04-19: 11.25 mg via INTRAMUSCULAR

## 2011-04-22 ENCOUNTER — Ambulatory Visit (INDEPENDENT_AMBULATORY_CARE_PROVIDER_SITE_OTHER): Payer: 59 | Admitting: Pharmacist

## 2011-04-22 DIAGNOSIS — I82409 Acute embolism and thrombosis of unspecified deep veins of unspecified lower extremity: Secondary | ICD-10-CM

## 2011-04-22 DIAGNOSIS — I2699 Other pulmonary embolism without acute cor pulmonale: Secondary | ICD-10-CM

## 2011-04-22 DIAGNOSIS — Z7901 Long term (current) use of anticoagulants: Secondary | ICD-10-CM

## 2011-04-22 NOTE — Patient Instructions (Signed)
Patient instructed to take medications as defined in the Anti-coagulation Track section of this encounter.  Patient instructed to take today's dose.  Patient verbalized understanding of these instructions.    

## 2011-04-22 NOTE — Progress Notes (Signed)
Anti-Coagulation Progress Note  Brandi Delacruz is a 45 y.o. female who is currently on an anti-coagulation regimen.    RECENT RESULTS: Recent results are below, the most recent result is correlated with a dose of 27.5 mg. per week--but with 3 days of missed doses while out of town: Lab Results  Component Value Date   INR 1.40 04/22/2011   INR 2.50 03/25/2011   INR 2.0 02/18/2011    ANTI-COAG DOSE:   Latest dosing instructions   Total Sun Mon Tue Wed Thu Fri Sat   27.5 2.5 mg 5 mg 5 mg 5 mg 5 mg 2.5 mg 2.5 mg    (5 mg0.5) (5 mg1) (5 mg1) (5 mg1) (5 mg1) (5 mg0.5) (5 mg0.5)         ANTICOAG SUMMARY: Anticoagulation Episode Summary              Current INR goal 2.0-3.0 Next INR check 05/20/2011   INR from last check 1.40! (04/22/2011)     Weekly max dose (mg)  Target end date 01/14/2012   Indications    [facility]    Send INR reminders to [facility]   Comments             ANTICOAG TODAY: Anticoagulation Summary as of 04/22/2011              INR goal 2.0-3.0     Selected INR 1.40! (04/22/2011) Next INR check 05/20/2011   Weekly max dose (mg)  Target end date 01/14/2012   Indications     Anticoagulation Episode Summary              INR check location [facility]    Send INR reminders to [facility]   Comments             PATIENT INSTRUCTIONS: Patient Instructions  Patient instructed to take medications as defined in the Anti-coagulation Track section of this encounter.  Patient instructed to take today's dose.  Patient verbalized understanding of these instructions.        FOLLOW-UP Return in 4 weeks (on 05/20/2011) for Follow up INR.  Hulen Luster, III Pharm.D., CACP

## 2011-04-24 ENCOUNTER — Encounter: Payer: Self-pay | Admitting: Internal Medicine

## 2011-04-24 ENCOUNTER — Ambulatory Visit (INDEPENDENT_AMBULATORY_CARE_PROVIDER_SITE_OTHER): Payer: 59 | Admitting: Internal Medicine

## 2011-04-24 DIAGNOSIS — I2699 Other pulmonary embolism without acute cor pulmonale: Secondary | ICD-10-CM

## 2011-04-24 DIAGNOSIS — D259 Leiomyoma of uterus, unspecified: Secondary | ICD-10-CM

## 2011-04-24 DIAGNOSIS — D649 Anemia, unspecified: Secondary | ICD-10-CM

## 2011-04-24 LAB — CBC WITH DIFFERENTIAL/PLATELET
Basophils Relative: 0 % (ref 0–1)
Eosinophils Absolute: 0.4 10*3/uL (ref 0.0–0.7)
HCT: 43.6 % (ref 36.0–46.0)
Hemoglobin: 13.4 g/dL (ref 12.0–15.0)
Lymphs Abs: 1.6 10*3/uL (ref 0.7–4.0)
MCH: 27.1 pg (ref 26.0–34.0)
MCHC: 30.7 g/dL (ref 30.0–36.0)
MCV: 88.1 fL (ref 78.0–100.0)
Monocytes Absolute: 0.6 10*3/uL (ref 0.1–1.0)
Monocytes Relative: 7 % (ref 3–12)

## 2011-04-24 NOTE — Assessment & Plan Note (Signed)
Review of records from hospitalization indicate that there was no clear provoking factor that contributed to the development of DVT/PE. I have discussed recommendations in terms of continuing anticoagulation. In terms of risk factors patient is a smoker but does not have family history of clotting disorders, no history of miscarriages, has not been on oral contraceptives. I explained to patient that she will need to continue anticoagulation for 6 months at which point she will have to be taken off the anticoagulation provided there is no new DVT or PE. At that point she will be off anticoagulation for several weeks and will have to check anti-coagulation panel to determine if patient needs to continue anticoagulation lifelong or can be discontinued.

## 2011-04-24 NOTE — Progress Notes (Signed)
  Subjective:    Patient ID: Brandi Delacruz, female    DOB: [birth year], 45 y.o.   MRN: [id number]  HPI  Patient is 45 year old female with past medical history outlined below who presents to clinic for followup and would like to know how long she needs to continue anticoagulation. She was hospitalized in May 2012 and was told she has had pulmonary embolus as well as DVT. She was started on Coumadin at that time and she wants to know how long to continue it. She denies other recent sicknesses or hospitalizations, no episodes of chest pain or shortness of breath, no abdominal or urinary concerns. She also denies weakness, headaches, dizziness.  Review of Systems    Constitutional: Denies fever, chills, diaphoresis, appetite change and fatigue.  HEENT: Denies photophobia, eye pain, redness, hearing loss, ear pain, congestion, sore throat, rhinorrhea, sneezing, mouth sores, trouble swallowing, neck pain, neck stiffness and tinnitus.   Respiratory: Denies SOB, DOE, cough, chest tightness,  and wheezing.   Cardiovascular: Denies chest pain, palpitations and leg swelling.  Gastrointestinal: Denies nausea, vomiting, abdominal pain, diarrhea, constipation, blood in stool and abdominal distention.  Genitourinary: Denies dysuria, urgency, frequency, hematuria, flank pain and difficulty urinating.  Musculoskeletal: Denies myalgias, back pain, joint swelling, arthralgias and gait problem.  Skin: Denies pallor, rash and wound.  Neurological: Denies dizziness, seizures, syncope, weakness, light-headedness, numbness and headaches.  Hematological: Denies adenopathy. Easy bruising, personal or family bleeding history  Psychiatric/Behavioral: Denies suicidal ideation, mood changes, confusion, nervousness, sleep disturbance and agitation   Objective:   Physical Exam  Constitutional: Vital signs reviewed.  Patient is a well-developed and well-nourished in no acute distress and cooperative with exam. Alert and  oriented x3.  Cardiovascular: RRR, S1 normal, S2 normal, no MRG, pulses symmetric and intact bilaterally Pulmonary/Chest: CTAB, no wheezes, rales, or rhonchi Abdominal: Soft. Non-tender, non-distended, bowel sounds are normal, no masses, organomegaly, or guarding present.  Musculoskeletal: No joint deformities, erythema, or stiffness, ROM full and no nontender         Assessment & Plan:

## 2011-04-24 NOTE — Assessment & Plan Note (Signed)
She is following with OB/GYN. She was recently started on Depo-Provera. I will check a CBC today.

## 2011-05-20 ENCOUNTER — Ambulatory Visit (INDEPENDENT_AMBULATORY_CARE_PROVIDER_SITE_OTHER): Payer: 59 | Admitting: Pharmacist

## 2011-05-20 DIAGNOSIS — I2699 Other pulmonary embolism without acute cor pulmonale: Secondary | ICD-10-CM

## 2011-05-20 DIAGNOSIS — I82409 Acute embolism and thrombosis of unspecified deep veins of unspecified lower extremity: Secondary | ICD-10-CM

## 2011-05-20 DIAGNOSIS — Z7901 Long term (current) use of anticoagulants: Secondary | ICD-10-CM

## 2011-05-20 MED ORDER — WARFARIN SODIUM 5 MG PO TABS: ORAL_TABLET | ORAL | Status: AC

## 2011-05-20 NOTE — Patient Instructions (Signed)
Patient instructed to take medications as defined in the Anti-coagulation Track section of this encounter.  Patient instructed to take today's dose.  Patient verbalized understanding of these instructions.    

## 2011-05-20 NOTE — Progress Notes (Signed)
Anti-Coagulation Progress Note  Brandi Delacruz is a 45 y.o. female who is currently on an anti-coagulation regimen.    RECENT RESULTS: Recent results are below, the most recent result is correlated with a dose of 27.5 mg. per week: Lab Results  Component Value Date   INR 3.20 05/20/2011   INR 1.40 04/22/2011   INR 2.50 03/25/2011    ANTI-COAG DOSE:   Latest dosing instructions   Total Sun Mon Tue Wed Thu Fri Sat   25 2.5 mg 5 mg 2.5 mg 5 mg 2.5 mg 5 mg 2.5 mg    (5 mg0.5) (5 mg1) (5 mg0.5) (5 mg1) (5 mg0.5) (5 mg1) (5 mg0.5)         ANTICOAG SUMMARY: Anticoagulation Episode Summary              Current INR goal 2.0-3.0 Next INR check 06/10/2011   INR from last check 3.20! (05/20/2011)     Weekly max dose (mg)  Target end date 01/14/2012   Indications    [facility] location [facility]    Send INR reminders to [facility]   Comments             ANTICOAG TODAY: Anticoagulation Summary as of 05/20/2011              INR goal 2.0-3.0     Selected INR 3.20! (05/20/2011) Next INR check 06/10/2011   Weekly max dose (mg)  Target end date 01/14/2012   Indications     Anticoagulation Episode Summary              INR check location [facility]    Send INR reminders to [facility]   Comments             PATIENT INSTRUCTIONS: Patient Instructions  Patient instructed to take medications as defined in the Anti-coagulation Track section of this encounter.  Patient instructed to take today's dose.  Patient verbalized understanding of these instructions.        FOLLOW-UP Return in 3 weeks (on 06/10/2011) for Follow up INR.  Hulen Luster, III Pharm.D., CACP

## 2011-06-10 ENCOUNTER — Ambulatory Visit: Payer: 59

## 2011-11-09 IMAGING — CT CT ANGIO CHEST
2 of 6 series · 19 of 36 positions shown · IV contrast (CONTRAST)
Comparison: None

CLINICAL DATA: Chest pain.  DVT.  Hemoglobin is 4.0.  Quit smoking
1 week ago.

CT ANGIOGRAPHY CHEST WITH CONTRAST
TECHNIQUE: Multidetector CT imaging of the chest was performed
using the standard protocol during bolus administration of
intravenous contrast.  Multiplanar CT image reconstructions
including MIPs were obtained to evaluate the vascular anatomy.
Contrast:  60 ml Tmnipaque-KPP

[Series 4: pe · axial · 0.66mm/px · z∈[-249,-9]mm · 18 of 131 slices shown]
[im 6/131  lung]
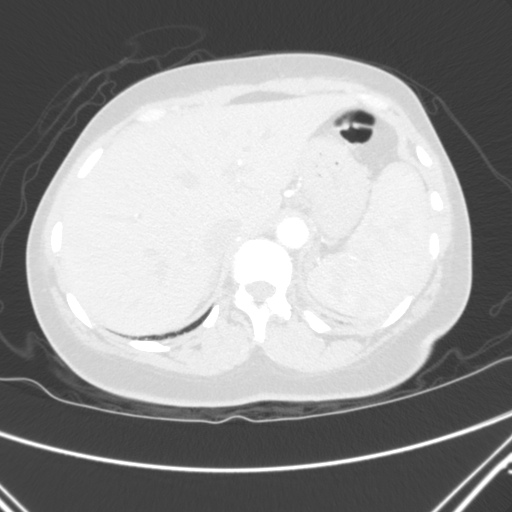
[im 16/131  mediastinal]
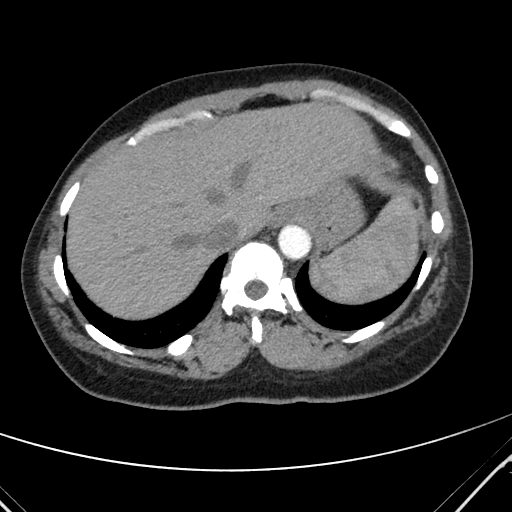
[im 21/131  lung]
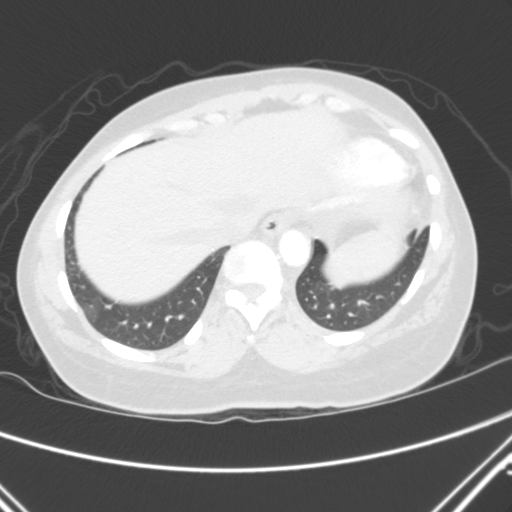
[im 26/131  mediastinal]
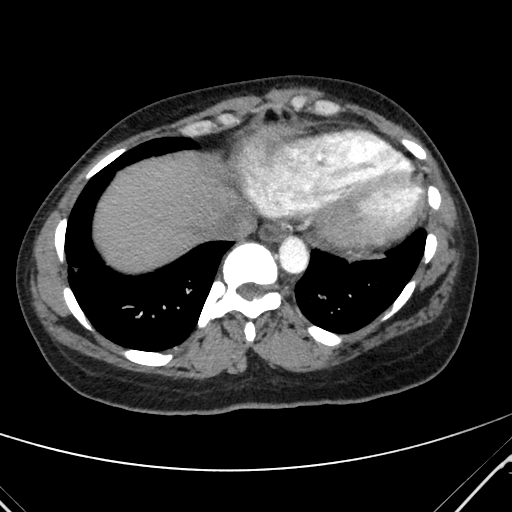
[im 36/131  lung]
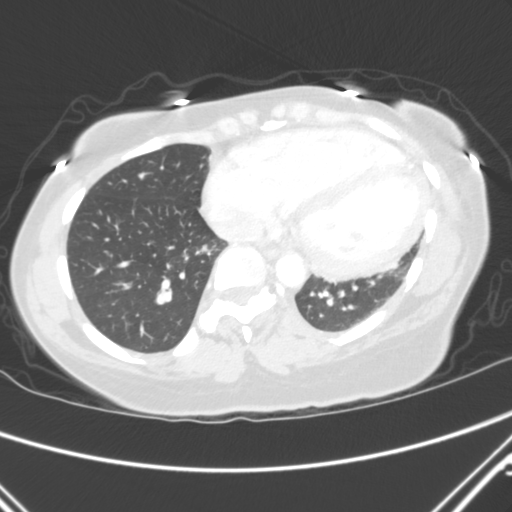
[im 41/131  mediastinal]
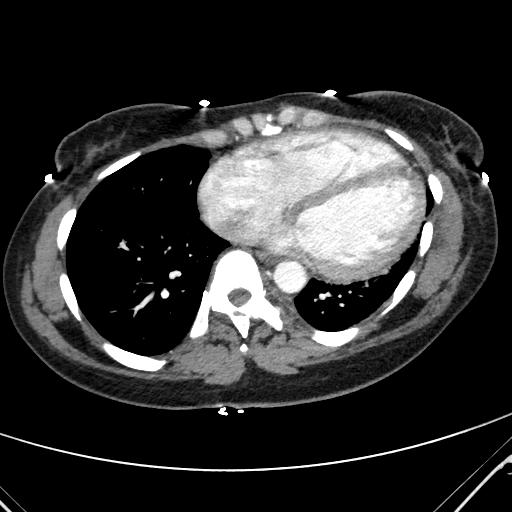
[im 51/131  lung]
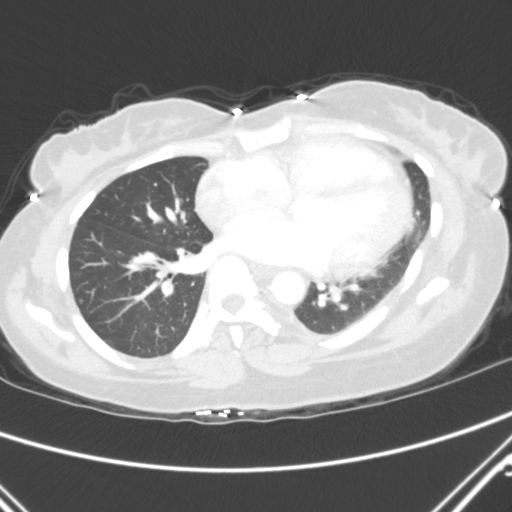
[im 56/131  mediastinal]
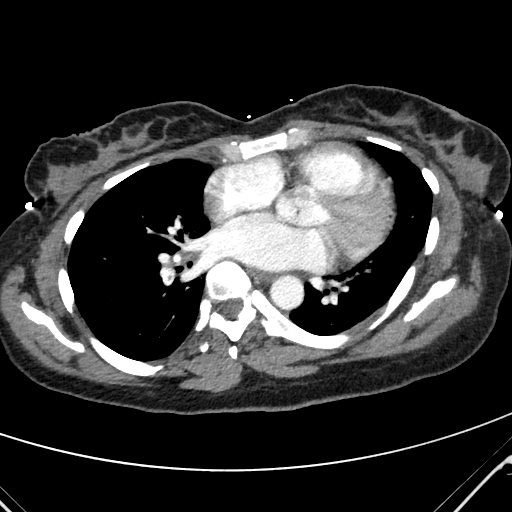
[im 61/131  lung]
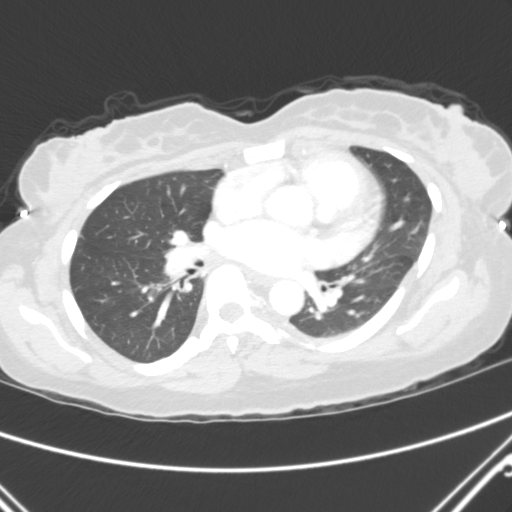
[im 71/131  mediastinal]
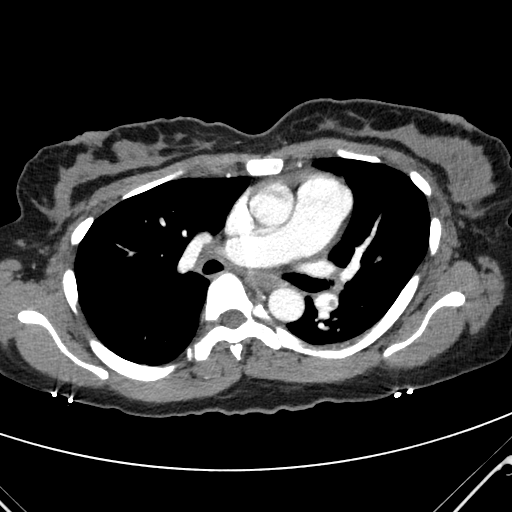
[im 76/131  lung]
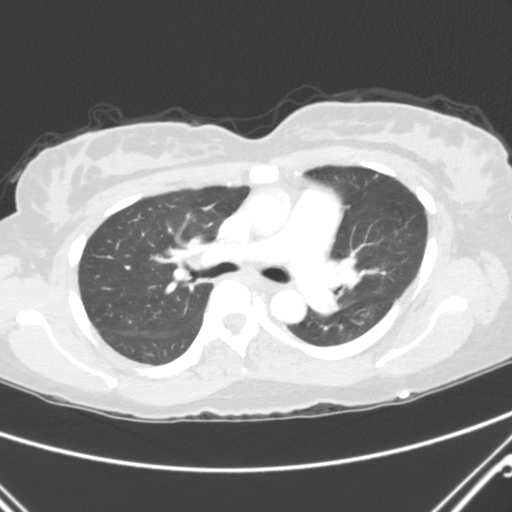
[im 81/131  mediastinal]
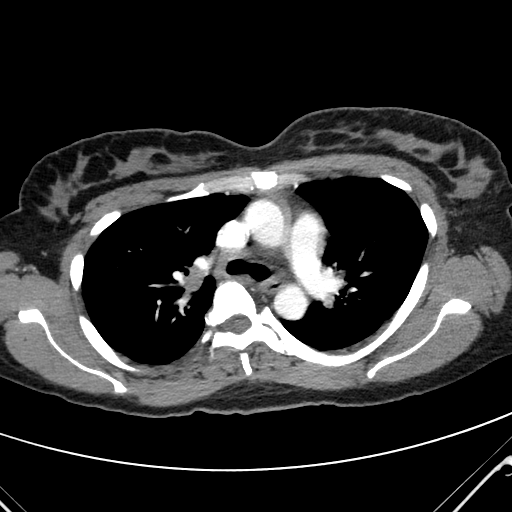
[im 91/131  lung]
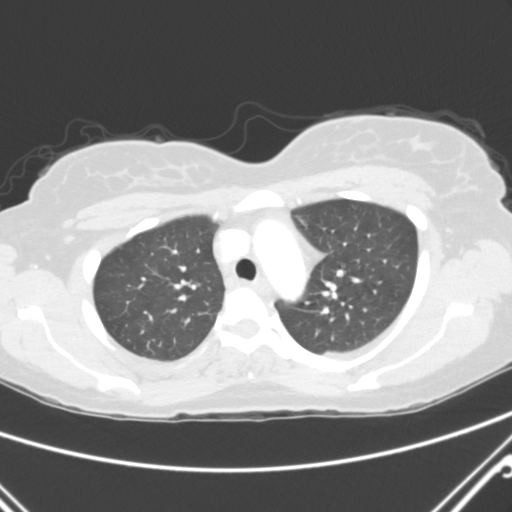
[im 96/131  mediastinal]
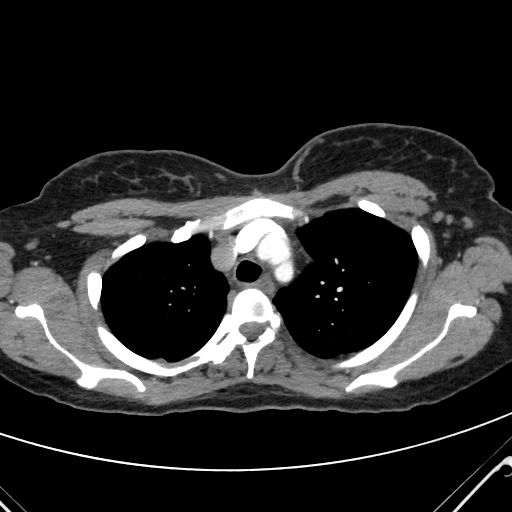
[im 106/131  lung]
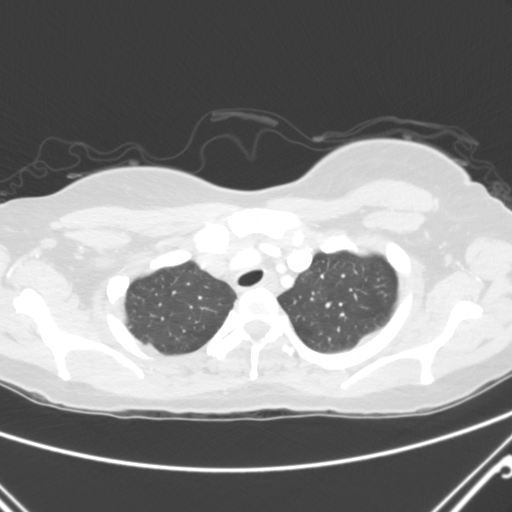
[im 111/131  mediastinal]
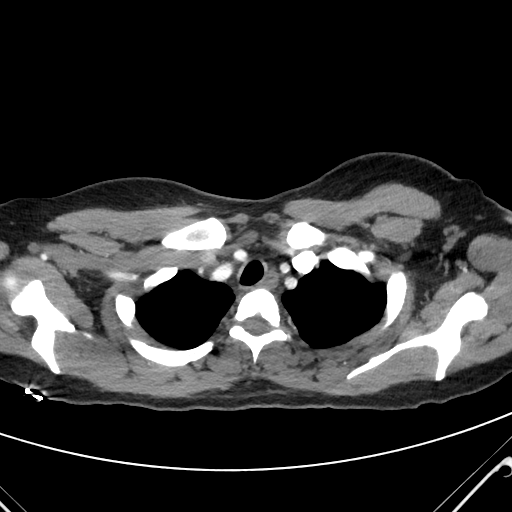
[im 116/131  lung]
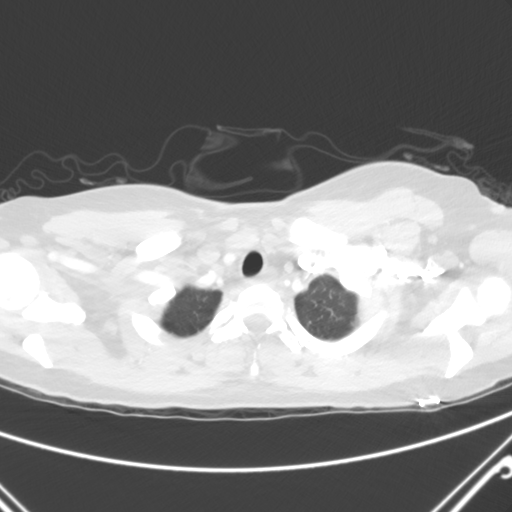
[im 126/131  mediastinal]
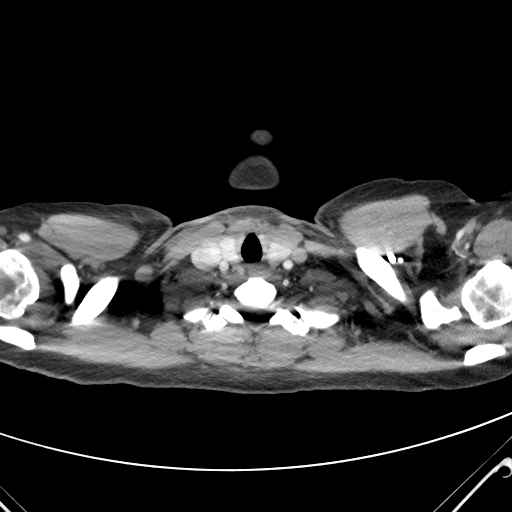

[mpr, coronals, coronal · coronal · 0.66mm/px · 1 of 100 slices shown]
[im 50/100  mediastinal]
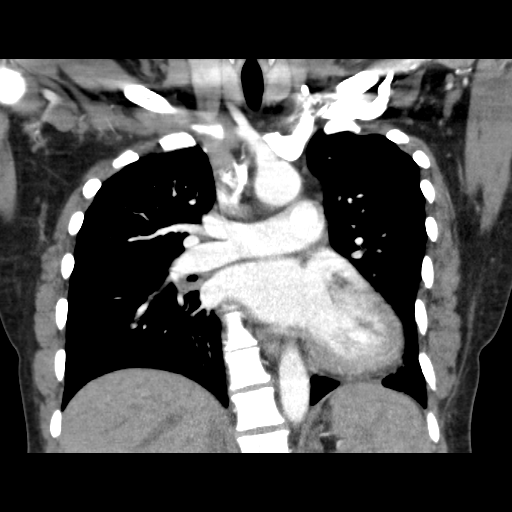

[19 of 36 positions shown; findings below may reference images not displayed]

FINDINGS: There are pulmonary emboli involving the upper and lower
lobe branches of the pulmonary arteries bilaterally.  Largest
embolus is within the right upper lobe branch.  The heart is
enlarged.  No pulmonary edema.  There are small peripheral
pulmonary opacities in the upper lower lobes bilaterally,
consistent with small infarcts.  No pleural effusions. The
visualized portion of the thyroid gland has a normal appearance.
Small, nonspecific mediastinal lymph nodes are present.

Images of the upper abdomen are unremarkable.  The there is
scoliosis of the thoracic spine, convex to the right.  No
significant degenerative change.  No lytic or blastic vertebral
lesions.  No hemivertebra noted.

Review of the MIP images confirms the above findings.
IMPRESSION: 1.  Pulmonary emboli involving upper and lower lobe branches of the
pulmonary arteries bilaterally with moderate clot burden.
2.  Mild cardiomegaly.
3.  Small bilateral pulmonary infarcts.
4.  Thoracic scoliosis.

Critical test results telephoned to Dr. Temam at the time of
interpretation on 01/06/2011 at [DATE] p.m.

## 2012-07-15 ENCOUNTER — Encounter: Payer: Self-pay | Admitting: Internal Medicine

## 2014-01-14 ENCOUNTER — Ambulatory Visit (HOSPITAL_COMMUNITY): Admission: AD | Admit: 2014-01-14 | Payer: Self-pay | Source: Home / Self Care | Admitting: Psychiatry

## 2019-11-05 ENCOUNTER — Encounter (HOSPITAL_COMMUNITY): Payer: Self-pay

## 2019-11-05 ENCOUNTER — Other Ambulatory Visit: Payer: Self-pay

## 2019-11-05 ENCOUNTER — Emergency Department (HOSPITAL_COMMUNITY)
Admission: EM | Admit: 2019-11-05 | Discharge: 2019-11-05 | Disposition: A | Discharge status: Home / Self Care | Payer: BC Managed Care – PPO | Attending: Emergency Medicine | Admitting: Emergency Medicine

## 2019-11-05 DIAGNOSIS — I10 Essential (primary) hypertension: Secondary | ICD-10-CM | POA: Insufficient documentation

## 2019-11-05 DIAGNOSIS — Z87891 Personal history of nicotine dependence: Secondary | ICD-10-CM | POA: Diagnosis not present

## 2019-11-05 DIAGNOSIS — R030 Elevated blood-pressure reading, without diagnosis of hypertension: Secondary | ICD-10-CM | POA: Diagnosis present

## 2019-11-05 LAB — CBC WITH DIFFERENTIAL/PLATELET
Abs Immature Granulocytes: 0.02 10*3/uL (ref 0.00–0.07)
Basophils Absolute: 0 10*3/uL (ref 0.0–0.1)
Basophils Relative: 0 %
Eosinophils Absolute: 0.1 10*3/uL (ref 0.0–0.5)
Eosinophils Relative: 1 %
HCT: 45.9 % (ref 36.0–46.0)
Hemoglobin: 14.8 g/dL (ref 12.0–15.0)
Immature Granulocytes: 0 %
Lymphocytes Relative: 20 %
Lymphs Abs: 2.1 10*3/uL (ref 0.7–4.0)
MCH: 28.4 pg (ref 26.0–34.0)
MCHC: 32.2 g/dL (ref 30.0–36.0)
MCV: 87.9 fL (ref 80.0–100.0)
Monocytes Absolute: 0.5 10*3/uL (ref 0.1–1.0)
Monocytes Relative: 5 %
Neutro Abs: 7.6 10*3/uL (ref 1.7–7.7)
Neutrophils Relative %: 74 %
Platelets: 201 10*3/uL (ref 150–400)
RBC: 5.22 MIL/uL — ABNORMAL HIGH (ref 3.87–5.11)
RDW: 13.3 % (ref 11.5–15.5)
WBC: 10.3 10*3/uL (ref 4.0–10.5)
nRBC: 0 % (ref 0.0–0.2)

## 2019-11-05 LAB — COMPREHENSIVE METABOLIC PANEL
ALT: 11 U/L (ref 0–44)
AST: 17 U/L (ref 15–41)
Albumin: 4.1 g/dL (ref 3.5–5.0)
Alkaline Phosphatase: 63 U/L (ref 38–126)
Anion gap: 7 (ref 5–15)
BUN: 12 mg/dL (ref 6–20)
CO2: 26 mmol/L (ref 22–32)
Calcium: 8.8 mg/dL — ABNORMAL LOW (ref 8.9–10.3)
Chloride: 108 mmol/L (ref 98–111)
Creatinine, Ser: 0.81 mg/dL (ref 0.44–1.00)
GFR calc Af Amer: >60 mL/min (ref >60)
GFR calc non Af Amer: >60 mL/min (ref >60)
Glucose, Bld: 96 mg/dL (ref 70–99)
Potassium: 3.8 mmol/L (ref 3.5–5.1)
Sodium: 141 mmol/L (ref 135–145)
Total Bilirubin: 0.6 mg/dL (ref 0.3–1.2)
Total Protein: 7.3 g/dL (ref 6.5–8.1)

## 2019-11-05 MED ORDER — HYDRALAZINE HCL 20 MG/ML IJ SOLN
5.0000 mg | Freq: Once | INTRAMUSCULAR | Status: AC
  Administered 2019-11-05: 17:00:00 5 mg via INTRAVENOUS
  Filled 2019-11-05: qty 1

## 2019-11-05 MED ORDER — LISINOPRIL 20 MG PO TABS: 20.0000 mg | ORAL_TABLET | Freq: Every day | ORAL | 2 refills | Status: AC

## 2019-11-05 MED ORDER — LABETALOL HCL 5 MG/ML IV SOLN
20.0000 mg | Freq: Once | INTRAVENOUS | Status: AC
  Administered 2019-11-05: 20 mg via INTRAVENOUS
  Filled 2019-11-05: qty 4

## 2019-11-05 NOTE — Discharge Instructions (Addendum)
Follow-up with your doctor next week for recheck 

## 2019-11-05 NOTE — ED Triage Notes (Signed)
Patient states she was prescribed HCTZ last year, but physician would refill due to patient not following up as scheduled. Patient states she has been without BP med >6 months.  BP in triage- 205/131.

## 2019-11-05 NOTE — ED Provider Notes (Signed)
[facility] DEPT Provider Note   CSN: [id number] Arrival date & time: 11/05/19  1644     History Chief Complaint  Patient presents with  . Hypertension    Brandi Delacruz is a 54 y.o. female.  Patient presents emergency department because she had blood pressure checked at work and it was elevated.  She has not been on medicine for many months  The history is provided by the patient. No language interpreter was used.  Hypertension This is a recurrent problem. The current episode started 12 to 24 hours ago. The problem occurs constantly. The problem has not changed since onset.Pertinent negatives include no chest pain, no abdominal pain and no headaches. Nothing aggravates the symptoms. Nothing relieves the symptoms. She has tried nothing for the symptoms. The treatment provided no relief.       Past Medical History:  Diagnosis Date  . Anemia   . DVT (deep venous thrombosis) ([place]) 01/2011  . PE (pulmonary embolism) 01/2011   Per CT Angio    Patient Active Problem List   Diagnosis Date Noted  . DVT (deep venous thrombosis) ([place]) 01/14/2011  . PE (pulmonary embolism) 01/14/2011  . Fibroid uterus 01/14/2011    Past Surgical History:  Procedure Laterality Date  . ABDOMINAL HYSTERECTOMY       OB History   No obstetric history on file.     History reviewed. No pertinent family history.  Social History   Tobacco Use  . Smoking status: Former Smoker    Types: Cigarettes    Quit date: 12/31/2010    Years since quitting: 8.8  . Smokeless tobacco: Never Used  Substance Use Topics  . Alcohol use: No  . Drug use: No    Home Medications Prior to Admission medications   Medication Sig Start Date End Date Taking? Authorizing Provider  lisinopril (ZESTRIL) 20 MG tablet Take 1 tablet (20 mg total) by mouth daily. 11/05/19   Milton Ferguson, MD  warfarin (COUMADIN) 5 MG tablet Take as directed by anticoagulation clinic provider. Patient not  taking: Reported on 11/05/2019 05/20/11   Burman Freestone, MD    Allergies    Patient has no known allergies.  Review of Systems   Review of Systems  Constitutional: Negative for appetite change and fatigue.  HENT: Negative for congestion, ear discharge and sinus pressure.   Eyes: Negative for discharge.  Respiratory: Negative for cough.   Cardiovascular: Negative for chest pain.  Gastrointestinal: Negative for abdominal pain and diarrhea.  Genitourinary: Negative for frequency and hematuria.  Musculoskeletal: Negative for back pain.  Skin: Negative for rash.  Neurological: Negative for seizures and headaches.  Psychiatric/Behavioral: Negative for hallucinations.    Physical Exam Updated Vital Signs BP (!) 152/116   Pulse 84   Temp 98.5 F (36.9 C) (Oral)   Resp 19   Ht 5\' 3"  (1.6 m)   Wt 68 kg   LMP 12/31/2010   SpO2 98%   BMI 26.57 kg/m   Physical Exam Vitals and nursing note reviewed.  Constitutional:      Appearance: She is well-developed.  HENT:     Head: Normocephalic.     Nose: Nose normal.  Eyes:     General: No scleral icterus.    Conjunctiva/sclera: Conjunctivae normal.  Neck:     Thyroid: No thyromegaly.  Cardiovascular:     Rate and Rhythm: Normal rate and regular rhythm.     Heart sounds: No murmur. No friction rub. No gallop.  Pulmonary:     Breath sounds: No stridor. No wheezing or rales.  Chest:     Chest wall: No tenderness.  Abdominal:     General: There is no distension.     Tenderness: There is no abdominal tenderness. There is no rebound.  Musculoskeletal:        General: Normal range of motion.     Cervical back: Neck supple.  Lymphadenopathy:     Cervical: No cervical adenopathy.  Skin:    Findings: No erythema or rash.  Neurological:     Mental Status: She is alert and oriented to person, place, and time.     Motor: No abnormal muscle tone.     Coordination: Coordination normal.  Psychiatric:        Behavior: Behavior  normal.     ED Results / Procedures / Treatments   Labs (all labs ordered are listed, but only abnormal results are displayed) Labs Reviewed  CBC WITH DIFFERENTIAL/PLATELET - Abnormal; Notable for the following components:      Result Value   RBC 5.22 (*)    All other components within normal limits  COMPREHENSIVE METABOLIC PANEL - Abnormal; Notable for the following components:   Calcium 8.8 (*)    All other components within normal limits    EKG None  Radiology No results found.  Procedures Procedures (including critical care time)  Medications Ordered in ED Medications  hydrALAZINE (APRESOLINE) injection 5 mg (5 mg Intravenous Given 11/05/19 1725)  labetalol (NORMODYNE) injection 20 mg (20 mg Intravenous Given 11/05/19 1822)    ED Course  I have reviewed the triage vital signs and the nursing notes.  Pertinent labs & imaging results that were available during my care of the patient were reviewed by me and considered in my medical decision making (see chart for details). CRITICAL CARE Performed by: Milton Ferguson Total critical care time: 35 minutes Critical care time was exclusive of separately billable procedures and treating other patients. Critical care was necessary to treat or prevent imminent or life-threatening deterioration. Critical care was time spent personally by me on the following activities: development of treatment plan with patient and/or surrogate as well as nursing, discussions with consultants, evaluation of patient's response to treatment, examination of patient, obtaining history from patient or surrogate, ordering and performing treatments and interventions, ordering and review of laboratory studies, ordering and review of radiographic studies, pulse oximetry and re-evaluation of patient's condition.    MDM Rules/Calculators/A&P                     Patient with significant hypertension.  She started lisinopril follow-up next week with PCP  Final  Clinical Impression(s) / ED Diagnoses Final diagnoses:  Essential hypertension    Rx / DC Orders ED Discharge Orders         Ordered    lisinopril (ZESTRIL) 20 MG tablet  Daily     11/05/19 1911           Milton Ferguson, MD 11/05/19 [phone]
